# Patient Record
Sex: Female | Born: 1950
Health system: Southern US, Community
[De-identification: ages and names within clinical notes are randomized; demographics above are authoritative.]

## PROBLEM LIST (undated history)

## (undated) DIAGNOSIS — Z860100 Personal history of colon polyps, unspecified: Secondary | ICD-10-CM

## (undated) HISTORY — DX: Personal history of colon polyps, unspecified: Z86.0100

## (undated) HISTORY — PX: ABDOMINAL HYSTERECTOMY: SHX81

## (undated) HISTORY — PX: ROTATOR CUFF REPAIR: SHX139

## (undated) HISTORY — DX: Personal history of colonic polyps: Z86.010

---

## 1999-02-11 ENCOUNTER — Other Ambulatory Visit: Admission: RE | Admit: 1999-02-11 | Discharge: 1999-02-11 | Payer: Self-pay | Admitting: Gynecology

## 2000-04-10 ENCOUNTER — Other Ambulatory Visit: Admission: RE | Admit: 2000-04-10 | Discharge: 2000-04-10 | Payer: Self-pay | Admitting: Gynecology

## 2000-08-11 ENCOUNTER — Ambulatory Visit (HOSPITAL_COMMUNITY): Admission: RE | Admit: 2000-08-11 | Discharge: 2000-08-11 | Payer: Self-pay | Admitting: *Deleted

## 2000-08-11 ENCOUNTER — Encounter (INDEPENDENT_AMBULATORY_CARE_PROVIDER_SITE_OTHER): Payer: Self-pay

## 2001-04-13 ENCOUNTER — Other Ambulatory Visit: Admission: RE | Admit: 2001-04-13 | Discharge: 2001-04-13 | Payer: Self-pay | Admitting: Gynecology

## 2001-07-18 ENCOUNTER — Encounter: Payer: Self-pay | Admitting: Gynecology

## 2001-07-18 ENCOUNTER — Encounter: Admission: RE | Admit: 2001-07-18 | Discharge: 2001-07-18 | Payer: Self-pay | Admitting: Gynecology

## 2002-05-24 ENCOUNTER — Other Ambulatory Visit: Admission: RE | Admit: 2002-05-24 | Discharge: 2002-05-24 | Payer: Self-pay | Admitting: Gynecology

## 2003-02-06 ENCOUNTER — Encounter: Admission: RE | Admit: 2003-02-06 | Discharge: 2003-02-06 | Payer: Self-pay | Admitting: Gynecology

## 2003-02-06 ENCOUNTER — Encounter: Payer: Self-pay | Admitting: Gynecology

## 2003-05-12 ENCOUNTER — Other Ambulatory Visit: Admission: RE | Admit: 2003-05-12 | Discharge: 2003-05-12 | Payer: Self-pay | Admitting: Gynecology

## 2003-09-19 ENCOUNTER — Ambulatory Visit (HOSPITAL_COMMUNITY): Admission: RE | Admit: 2003-09-19 | Discharge: 2003-09-19 | Payer: Self-pay | Admitting: *Deleted

## 2003-09-19 ENCOUNTER — Encounter (INDEPENDENT_AMBULATORY_CARE_PROVIDER_SITE_OTHER): Payer: Self-pay | Admitting: Specialist

## 2004-01-14 ENCOUNTER — Emergency Department (HOSPITAL_COMMUNITY): Admission: EM | Admit: 2004-01-14 | Discharge: 2004-01-14 | Payer: Self-pay | Admitting: Emergency Medicine

## 2004-03-31 ENCOUNTER — Encounter: Admission: RE | Admit: 2004-03-31 | Discharge: 2004-03-31 | Payer: Self-pay | Admitting: Gynecology

## 2004-08-31 ENCOUNTER — Other Ambulatory Visit: Admission: RE | Admit: 2004-08-31 | Discharge: 2004-08-31 | Payer: Self-pay | Admitting: Gynecology

## 2005-05-04 ENCOUNTER — Encounter: Admission: RE | Admit: 2005-05-04 | Discharge: 2005-05-04 | Payer: Self-pay | Admitting: Family Medicine

## 2005-06-24 ENCOUNTER — Encounter: Admission: RE | Admit: 2005-06-24 | Discharge: 2005-06-24 | Payer: Self-pay | Admitting: Family Medicine

## 2005-09-19 ENCOUNTER — Other Ambulatory Visit: Admission: RE | Admit: 2005-09-19 | Discharge: 2005-09-19 | Payer: Self-pay | Admitting: Gynecology

## 2006-01-12 ENCOUNTER — Encounter: Admission: RE | Admit: 2006-01-12 | Discharge: 2006-01-12 | Payer: Self-pay | Admitting: Gynecology

## 2006-05-11 ENCOUNTER — Encounter: Admission: RE | Admit: 2006-05-11 | Discharge: 2006-05-11 | Payer: Self-pay | Admitting: Family Medicine

## 2006-05-31 ENCOUNTER — Ambulatory Visit (HOSPITAL_COMMUNITY): Admission: RE | Admit: 2006-05-31 | Discharge: 2006-06-01 | Payer: Self-pay | Admitting: Orthopedic Surgery

## 2006-09-26 ENCOUNTER — Other Ambulatory Visit: Admission: RE | Admit: 2006-09-26 | Discharge: 2006-09-26 | Payer: Self-pay | Admitting: Gynecology

## 2007-05-31 ENCOUNTER — Encounter: Admission: RE | Admit: 2007-05-31 | Discharge: 2007-05-31 | Payer: Self-pay | Admitting: Family Medicine

## 2008-06-02 ENCOUNTER — Encounter: Admission: RE | Admit: 2008-06-02 | Discharge: 2008-06-02 | Payer: Self-pay | Admitting: Family Medicine

## 2009-06-03 ENCOUNTER — Encounter: Admission: RE | Admit: 2009-06-03 | Discharge: 2009-06-03 | Payer: Self-pay | Admitting: Family Medicine

## 2010-06-04 ENCOUNTER — Encounter
Admission: RE | Admit: 2010-06-04 | Discharge: 2010-06-04 | Payer: Self-pay | Source: Home / Self Care | Attending: Gynecology | Admitting: Gynecology

## 2010-10-08 NOTE — Op Note (Signed)
Nicole Acosta, Nicole Acosta                ACCOUNT NO.:  [id number]   MEDICAL RECORD NO.:  [id number]          PATIENT TYPE:  AMB   LOCATION:  DAY                          FACILITY:  [facility]   PHYSICIAN:  Ronald A. Gioffre, M.D.DATE OF BIRTH:  1950-08-01   DATE OF PROCEDURE:  05/31/2006  DATE OF DISCHARGE:                               OPERATIVE REPORT   SURGEON:  Georges Lynch. Darrelyn Hillock, M.D.   OPERATIONS:  Jamelle Rushing, P.A.   PREOP DIAGNOSES:  1. Os acromiale left shoulder.  2. Full-thickness tear rotator cuff tendon on the right.   POSTOP DIAGNOSES:  1. Os acromiale left shoulder.  2. Full-thickness tear rotator cuff tendon on the right.   OPERATION:  1. excision of an os acromiale left shoulder.  2. Open decompression left shoulder by performing an acromionectomy      and acromioplasty.  3. A primary repair of the rotator cuff tendon, left shoulder.   DESCRIPTION OF PROCEDURE:  Under general anesthesia, a routine  orthopedic prep and draping of the left shoulder was carried out.  The  patient had 1 gram of IV Ancef.  An incision was made over the anterior  aspect of the left shoulder; bleeders identified and cauterized.  At  this time a self-retaining retractor was inserted.  I then identified  the os acromiale and excised it.  I then protected the rotator cuff with  a Bennett retractor and did a partial acromionectomy with the  oscillating saw; and an acromioplasty utilizing the bur.  I thoroughly  irrigated out the shoulder; and I removed the subdeltoid bursa as well.  I inspected the cuff, there was a small hole in the cuff which was full-  thickness.  I then did a primary repair of this with #1 Ethilon suture.  Following this, I irrigated the shoulder, again.  I reapproximated the  deltoid tendon on the muscle in the usual fashion.  Subcu was closed  with #0 Vicryl, and the skin with metal staples.  A sterile Neosporin  dressing was applied.  She was placed in a shoulder  immobilizer.           ______________________________  Georges Lynch Darrelyn Hillock, M.D.     RAG/MEDQ  D:  05/31/2006  T:  05/31/2006  Job:  [id number]

## 2011-05-20 ENCOUNTER — Other Ambulatory Visit: Payer: Self-pay | Admitting: Family Medicine

## 2011-05-20 DIAGNOSIS — Z1231 Encounter for screening mammogram for malignant neoplasm of breast: Secondary | ICD-10-CM

## 2011-06-06 ENCOUNTER — Ambulatory Visit: Payer: Self-pay

## 2011-06-07 ENCOUNTER — Ambulatory Visit
Admission: RE | Admit: 2011-06-07 | Discharge: 2011-06-07 | Disposition: A | Discharge status: Home / Self Care | Payer: BC Managed Care – PPO | Source: Ambulatory Visit | Attending: Family Medicine | Admitting: Family Medicine

## 2011-06-07 DIAGNOSIS — Z1231 Encounter for screening mammogram for malignant neoplasm of breast: Secondary | ICD-10-CM

## 2011-06-08 ENCOUNTER — Ambulatory Visit: Payer: Self-pay

## 2012-05-21 ENCOUNTER — Other Ambulatory Visit: Payer: Self-pay | Admitting: Gynecology

## 2012-05-21 DIAGNOSIS — Z1231 Encounter for screening mammogram for malignant neoplasm of breast: Secondary | ICD-10-CM

## 2012-06-08 ENCOUNTER — Ambulatory Visit
Admission: RE | Admit: 2012-06-08 | Discharge: 2012-06-08 | Disposition: A | Discharge status: Home / Self Care | Payer: BC Managed Care – PPO | Source: Ambulatory Visit | Attending: Gynecology | Admitting: Gynecology

## 2012-06-08 DIAGNOSIS — Z1231 Encounter for screening mammogram for malignant neoplasm of breast: Secondary | ICD-10-CM

## 2013-05-24 ENCOUNTER — Other Ambulatory Visit: Payer: Self-pay

## 2013-05-24 DIAGNOSIS — Z1231 Encounter for screening mammogram for malignant neoplasm of breast: Secondary | ICD-10-CM

## 2013-06-11 ENCOUNTER — Ambulatory Visit
Admission: RE | Admit: 2013-06-11 | Discharge: 2013-06-11 | Disposition: A | Discharge status: Home / Self Care | Payer: Self-pay | Source: Ambulatory Visit

## 2013-06-11 DIAGNOSIS — Z1231 Encounter for screening mammogram for malignant neoplasm of breast: Secondary | ICD-10-CM

## 2014-05-26 ENCOUNTER — Other Ambulatory Visit: Payer: Self-pay

## 2014-05-26 DIAGNOSIS — Z1231 Encounter for screening mammogram for malignant neoplasm of breast: Secondary | ICD-10-CM

## 2014-06-12 ENCOUNTER — Ambulatory Visit
Admission: RE | Admit: 2014-06-12 | Discharge: 2014-06-12 | Disposition: A | Discharge status: Home / Self Care | Payer: BLUE CROSS/BLUE SHIELD | Source: Ambulatory Visit

## 2014-06-12 DIAGNOSIS — Z1231 Encounter for screening mammogram for malignant neoplasm of breast: Secondary | ICD-10-CM

## 2015-03-30 ENCOUNTER — Other Ambulatory Visit: Payer: Self-pay

## 2015-03-30 DIAGNOSIS — Z1231 Encounter for screening mammogram for malignant neoplasm of breast: Secondary | ICD-10-CM

## 2015-06-15 ENCOUNTER — Ambulatory Visit: Payer: BLUE CROSS/BLUE SHIELD

## 2015-06-16 ENCOUNTER — Ambulatory Visit
Admission: RE | Admit: 2015-06-16 | Discharge: 2015-06-16 | Disposition: A | Discharge status: Home / Self Care | Payer: Managed Care, Other (non HMO) | Source: Ambulatory Visit

## 2015-06-16 DIAGNOSIS — Z1231 Encounter for screening mammogram for malignant neoplasm of breast: Secondary | ICD-10-CM

## 2016-02-24 DIAGNOSIS — M25562 Pain in left knee: Secondary | ICD-10-CM | POA: Diagnosis not present

## 2016-04-18 ENCOUNTER — Other Ambulatory Visit: Payer: Self-pay | Admitting: Family Medicine

## 2016-04-18 DIAGNOSIS — Z1231 Encounter for screening mammogram for malignant neoplasm of breast: Secondary | ICD-10-CM

## 2016-06-16 ENCOUNTER — Ambulatory Visit
Admission: RE | Admit: 2016-06-16 | Discharge: 2016-06-16 | Disposition: A | Discharge status: Home / Self Care | Payer: PPO | Source: Ambulatory Visit | Attending: Family Medicine | Admitting: Family Medicine

## 2016-06-16 DIAGNOSIS — Z1231 Encounter for screening mammogram for malignant neoplasm of breast: Secondary | ICD-10-CM | POA: Diagnosis not present

## 2016-06-24 DIAGNOSIS — H9011 Conductive hearing loss, unilateral, right ear, with unrestricted hearing on the contralateral side: Secondary | ICD-10-CM | POA: Diagnosis not present

## 2016-06-24 DIAGNOSIS — H6121 Impacted cerumen, right ear: Secondary | ICD-10-CM | POA: Diagnosis not present

## 2016-06-24 DIAGNOSIS — Z8669 Personal history of other diseases of the nervous system and sense organs: Secondary | ICD-10-CM | POA: Diagnosis not present

## 2016-06-24 DIAGNOSIS — F1020 Alcohol dependence, uncomplicated: Secondary | ICD-10-CM | POA: Diagnosis not present

## 2016-09-23 DIAGNOSIS — Z0000 Encounter for general adult medical examination without abnormal findings: Secondary | ICD-10-CM | POA: Diagnosis not present

## 2016-09-23 DIAGNOSIS — Z131 Encounter for screening for diabetes mellitus: Secondary | ICD-10-CM | POA: Diagnosis not present

## 2016-09-23 DIAGNOSIS — R9431 Abnormal electrocardiogram [ECG] [EKG]: Secondary | ICD-10-CM | POA: Diagnosis not present

## 2016-09-27 DIAGNOSIS — Z8249 Family history of ischemic heart disease and other diseases of the circulatory system: Secondary | ICD-10-CM | POA: Diagnosis not present

## 2016-09-27 DIAGNOSIS — R9431 Abnormal electrocardiogram [ECG] [EKG]: Secondary | ICD-10-CM | POA: Diagnosis not present

## 2017-05-08 ENCOUNTER — Other Ambulatory Visit: Payer: Self-pay | Admitting: Family Medicine

## 2017-05-08 DIAGNOSIS — Z139 Encounter for screening, unspecified: Secondary | ICD-10-CM

## 2017-06-05 DIAGNOSIS — H5203 Hypermetropia, bilateral: Secondary | ICD-10-CM | POA: Diagnosis not present

## 2017-06-05 DIAGNOSIS — H52223 Regular astigmatism, bilateral: Secondary | ICD-10-CM | POA: Diagnosis not present

## 2017-06-05 DIAGNOSIS — H524 Presbyopia: Secondary | ICD-10-CM | POA: Diagnosis not present

## 2017-06-19 ENCOUNTER — Ambulatory Visit
Admission: RE | Admit: 2017-06-19 | Discharge: 2017-06-19 | Disposition: A | Discharge status: Home / Self Care | Payer: PPO | Source: Ambulatory Visit | Attending: Family Medicine | Admitting: Family Medicine

## 2017-06-19 DIAGNOSIS — Z1231 Encounter for screening mammogram for malignant neoplasm of breast: Secondary | ICD-10-CM | POA: Diagnosis not present

## 2017-06-19 DIAGNOSIS — Z139 Encounter for screening, unspecified: Secondary | ICD-10-CM

## 2017-06-26 ENCOUNTER — Other Ambulatory Visit: Payer: Self-pay | Admitting: *Deleted

## 2017-06-26 ENCOUNTER — Encounter: Payer: Self-pay | Admitting: *Deleted

## 2017-06-26 NOTE — Patient Outreach (Signed)
[facility]TA Post screening call. Pt reports she is able to get to the MD office and has an appointment to have her AWV with Dr. Harrington Challenger in May. I encouraged her to do this instead of me coming to her as these visits are more geared towards the members who cannot get to the office easily. She said she would gladly keep her appointment with Dr. Harrington Challenger. I did tell her about [place] [facility] services and I will send her a brochure for her future reference. She has no [facility] care management needs at this time.  Nicole Acosta. Myrtie Neither, MSN, [facility] Gerontological Nurse Practitioner [facility] Care Management [phone]

## 2017-06-29 ENCOUNTER — Other Ambulatory Visit: Payer: Self-pay | Admitting: *Deleted

## 2017-06-29 NOTE — Patient Outreach (Signed)
[facility] WellCheck scheduled for Friday, February 8th at 11:00.   Eulah Pont. Myrtie Neither, MSN, [facility] Gerontological Nurse Practitioner [facility] Care Management [phone]

## 2017-06-30 ENCOUNTER — Other Ambulatory Visit: Payer: Self-pay | Admitting: *Deleted

## 2017-06-30 ENCOUNTER — Encounter: Payer: Self-pay | Admitting: *Deleted

## 2017-06-30 DIAGNOSIS — Z860100 Personal history of colon polyps, unspecified: Secondary | ICD-10-CM | POA: Insufficient documentation

## 2017-06-30 DIAGNOSIS — I10 Essential (primary) hypertension: Secondary | ICD-10-CM | POA: Insufficient documentation

## 2017-06-30 MED ORDER — LISINOPRIL 5 MG PO TABS: 5.0000 mg | ORAL_TABLET | Freq: Every day | ORAL | 0 refills | Status: AC

## 2017-06-30 NOTE — Patient Instructions (Addendum)
Ms. Favorite , Thank you for taking time see me for your Medicare Wellness Visit. I appreciate your ongoing commitment to your health goals. Please review the following plan we discussed and let me know if I can assist you in the future.   This is a list of the screening recommended for you and due dates:  Health Maintenance  Topic Date Due  .  Hepatitis C: One time screening is recommended by Center for Disease Control  (CDC) for  adults born from [birth year] through 1966.   09-Feb-1951  . Colon Cancer Screening  08/09/1999  . DEXA scan (bone density measurement)  08/09/2014  . Flu Shot  12/21/2016  . Mammogram  06/20/2019  . Tetanus Vaccine  01/10/2022  . Pneumonia vaccines  Completed    Please contact the [facility] [place] service at [phone] or e-mail at [email or web address].   Preventive Care 36 Years and Older, Female Preventive care refers to lifestyle choices and visits with your health care provider that can promote health and wellness. What does preventive care include?  A yearly physical exam. This is also called an annual well check.  Dental exams once or twice a year.  Routine eye exams. Ask your health care provider how often you should have your eyes checked.  Personal lifestyle choices, including: ? Daily care of your teeth and gums. ? Regular physical activity. ? Eating a healthy diet. ? Avoiding tobacco and drug use. ? Limiting alcohol use. ? Practicing safe sex. ? Taking low-dose aspirin every day. ? Taking vitamin and mineral supplements as recommended by your health care provider. What happens during an annual well check? The services and screenings done by your health care provider during your annual well check will depend on your age, overall health, lifestyle risk factors, and family history of disease. Counseling Your health care provider may ask you questions about your:  Alcohol use.  Tobacco use.  Drug use.  Emotional  well-being.  Home and relationship well-being.  Sexual activity.  Eating habits.  History of falls.  Memory and ability to understand (cognition).  Work and work Statistician.  Reproductive health.  Screening You may have the following tests or measurements:  Height, weight, and BMI.  Blood pressure.  Lipid and cholesterol levels. These may be checked every 5 years, or more frequently if you are over 35 years old.  Skin check.  Lung cancer screening. You may have this screening every year starting at age 64 if you have a 30-pack-year history of smoking and currently smoke or have quit within the past 15 years.  Fecal occult blood test (FOBT) of the stool. You may have this test every year starting at age 39.  Flexible sigmoidoscopy or colonoscopy. You may have a sigmoidoscopy every 5 years or a colonoscopy every 10 years starting at age 67.  Hepatitis C blood test.  Hepatitis B blood test.  Sexually transmitted disease (STD) testing.  Diabetes screening. This is done by checking your blood sugar (glucose) after you have not eaten for a while (fasting). You may have this done every 1-3 years.  Bone density scan. This is done to screen for osteoporosis. You may have this done starting at age 80.  Mammogram. This may be done every 1-2 years. Talk to your health care provider about how often you should have regular mammograms.  Talk with your health care provider about your test results, treatment options, and if necessary, the need for more tests. Vaccines Your health care provider may recommend  certain vaccines, such as:  Influenza vaccine. This is recommended every year.  Tetanus, diphtheria, and acellular pertussis (Tdap, Td) vaccine. You may need a Td booster every 10 years.  Varicella vaccine. You may need this if you have not been vaccinated.  Zoster vaccine. You may need this after age 70.  Measles, mumps, and rubella (MMR) vaccine. You may need at least  one dose of MMR if you were born in 1957 or later. You may also need a second dose.  Pneumococcal 13-valent conjugate (PCV13) vaccine. One dose is recommended after age 19.  Pneumococcal polysaccharide (PPSV23) vaccine. One dose is recommended after age 24.  Meningococcal vaccine. You may need this if you have certain conditions.  Hepatitis A vaccine. You may need this if you have certain conditions or if you travel or work in places where you may be exposed to hepatitis A.  Hepatitis B vaccine. You may need this if you have certain conditions or if you travel or work in places where you may be exposed to hepatitis B.  Haemophilus influenzae type b (Hib) vaccine. You may need this if you have certain conditions.  Talk to your health care provider about which screenings and vaccines you need and how often you need them. This information is not intended to replace advice given to you by your health care provider. Make sure you discuss any questions you have with your health care provider. Document Released: 06/05/2015 Document Revised: 01/27/2016 Document Reviewed: 03/10/2015 Elsevier Interactive Patient Education  [employer].

## 2017-06-30 NOTE — Patient Outreach (Addendum)
   [facility]A [place] Visit  Name: Nicole Acosta     DOB: 11-28-1950     MRN: [id number]  Nicole Acosta is a 67 y.o. female who presents for Medicare Annual  preventive examination.  BP (!) 164/84   Pulse 62   Resp 16   Ht 1.575 m (5\' 2" )   Wt 140 lb (63.5 kg)   SpO2 98%   BMI 25.61 kg/m   Advanced Directive information Does Patient Have a Medical Advance Directive?: Yes, Type of Advance Directive: Healthcare Power of [place];Living will scanned into [facility] Information    Name Relation Home Work [place] Son [phone]     Verlee Rossetti   [phone]     Past Medical History:  Diagnosis Date  . History of colon polyps    Past Surgical History:  Procedure Laterality Date  . ABDOMINAL HYSTERECTOMY    . ROTATOR CUFF REPAIR      Outpatient Encounter Medications as of 06/30/2017  Medication Sig  . MULTIPLE VITAMINS ESSENTIAL PO Take by mouth once.  Marland Kitchen SHINGRIX injection ADM 0.5ML IM UTD   No facility-administered encounter medications on file as of 06/30/2017.    Activities of Daily Living - independent    Patient Care Team: Lawerance Cruel, MD as PCP - General (Family Medicine)  Exercise Activities and Dietary recommendations Current Exercise Habits: Home exercise routine;Structured exercise class, Type of exercise: strength training/weights;stretching;treadmill;walking, Time (Minutes): 60, Frequency (Times/Week): 7, Weekly Exercise (Minutes/Week): 420, Intensity: Intense, Exercise limited by: None identified  Fall Risk Fall Risk  06/30/2017  Falls in the past year? No   Safety Do you have grab bars in the bathroom?: No Does your home have working smoke alarms?: Yes Do you consisitantly wear a seat belt?: Yes Depression Screen PHQ 2/9 Scores 06/30/2017 06/26/2017  PHQ - 2 Score 0 0    Immunization History  Administered Date(s) Administered  . Influenza,inj,quad, With Preservative 01/29/2016, 03/02/2017  . Pneumococcal Conjugate-13  10/16/2014  . Pneumococcal Polysaccharide-23 09/16/2015  . Tdap 01/11/2012  . Zoster 01/22/2013  Screening Tests Mammogram last done 06/19/17 Colonoscopy last done:   10/15/13, due and to be scheduled. DEXA:  Ask Dr. Harrington Challenger if needs, last done was in 2016.   Assessment: Encounter Diagnoses  Name Primary?  . Essential hypertension   . Hx of colonic polyps       Plan:    ADVISE DR. ROSS OF ELEVATED BP AT VISIT AND OFTEN ON HOME MONITOR. SUGGEST START LISINOPRIL 5 MG. Continue healthy eating habits, exercise. Counseled on reducing salt in her diet. Continue periodic BP monitoring at home. Call to schedule your colonoscopy.  I have personally reviewed and noted the following in the patient's chart:   . Medical and social history . Use of alcohol, tobacco or illicit drugs  . Current medications and supplements . Functional ability and status . Nutritional status . Physical activity . Advanced directives . List of other physicians . Hospitalizations, surgeries, and ER visits in previous 12 months . Vitals . Screenings to include cognitive, depression, and falls . Referrals and appointments  In addition, I have reviewed and discussed with patient certain preventive protocols, quality metrics, and best practice recommendations. A written personalized care plan for preventive services as well as general preventive health recommendations were provided to patient.     Eulah Pont. Teshawn Moan, MSN, GNP-BC  06/30/2017 Gerontological Nurse Practitioner [facility] Care Management

## 2017-06-30 NOTE — Patient Outreach (Signed)
[facility]A [place] Visit  Name: Nicole Acosta     DOB: Sep 28, 1950     MRN: [id number]  Nicole Acosta is a 67 y.o. female who presents for Medicare Annual  preventive examination.  BP (!) 164/84   Pulse 62   Resp 16   Ht 1.575 m (5\' 2" )   Wt 140 lb (63.5 kg)   SpO2 98%   BMI 25.61 kg/m   Advanced Directive information Does Patient Have a Medical Advance Directive?: Yes, Type of Advance Directive: Healthcare Power of [place];Living will  Contact Information    Name Relation Home Work [place] Son [phone]     Verlee Rossetti   [phone]      Past Medical History:  Diagnosis Date  . History of colon polyps    Past Surgical History:  Procedure Laterality Date  . ABDOMINAL HYSTERECTOMY    . ROTATOR CUFF REPAIR     No family history on file. Social History   Substance and Sexual Activity  Sexual Activity Not on file    Outpatient Encounter Medications as of 06/30/2017  Medication Sig  . MULTIPLE VITAMINS ESSENTIAL PO Take by mouth once.  Marland Kitchen SHINGRIX injection ADM 0.5ML IM UTD   No facility-administered encounter medications on file as of 06/30/2017.     Activities of Daily Living In your present state of health, do you have any difficulty performing the following activities: 06/30/2017  Hearing? N  Vision? N  Difficulty concentrating or making decisions? N  Walking or climbing stairs? N  Dressing or bathing? N  Doing errands, shopping? N  Preparing Food and eating ? N  Using the Toilet? N  In the past six months, have you accidently leaked urine? N  Do you have problems with loss of bowel control? N  Managing your Medications? N  Managing your Finances? N  Housekeeping or managing your Housekeeping? N  Some recent data might be hidden    Patient Care Team: Lawerance Cruel, MD as PCP - General (Family Medicine)  Diagnosis:    HTN Hx Colon Polyps   Exercise Activities and Dietary recommendations Current Exercise Habits: Home  exercise routine;Structured exercise class, Type of exercise: strength training/weights;stretching;treadmill;walking, Time (Minutes): 60, Frequency (Times/Week): 7, Weekly Exercise (Minutes/Week): 420, Intensity: Intense, Exercise limited by: None identified   Fall Risk Fall Risk  06/30/2017  Falls in the past year? No   Safety Do you have grab bars in the bathroom?: No Does your home have working smoke alarms?: Yes Do you consisitantly wear a seat belt?: Yes Depression Screen PHQ 2/9 Scores 06/30/2017 06/26/2017  PHQ - 2 Score 0 0    Immunization History  Administered Date(s) Administered  . Influenza,inj,quad, With Preservative 01/29/2016, 03/02/2017  . Pneumococcal Conjugate-13 10/16/2014  . Pneumococcal Polysaccharide-23 09/16/2015  . Tdap 01/11/2012  . Zoster 01/22/2013  Screening Tests Mammogram last done 06/19/17 Colonoscopy last done: DEXA:  Ask Dr. Harrington Challenger if needs      Plan:    ADVISE DR. ROSS OF ELEVATED BP AT VISIT AND OFTEN ON HOME MONITOR. SUGGEST START LISINOPRIL 5 MG. Pt to follow up with Dr. Harrington Challenger in May. Continue good health practices currently in action!  I have personally reviewed and noted the following in the patient's chart:   . Medical and social history . Use of alcohol, tobacco or illicit drugs  . Current medications and supplements . Functional ability and status . Nutritional status . Physical activity . Advanced directives . List of  other physicians . Hospitalizations, surgeries, and ER visits in previous 12 months . Vitals . Screenings to include cognitive, depression, and falls . Referrals and appointments  In addition, I have reviewed and discussed with patient certain preventive protocols, quality metrics, and best practice recommendations. A written personalized care plan for preventive services as well as general preventive health recommendations were provided to patient.     Eulah Pont. Antione Obar, MSN, GNP-BC  06/30/2017 Gerontological Nurse  Practitioner [facility] Care Management

## 2017-06-30 NOTE — Patient Outreach (Addendum)
HTA [name] Visit  Name: Nicole Acosta     DOB: 28-Nov-1950     MRN: [id number]  Nicole Acosta is a 67 y.o. female who presents for Medicare Annual  preventive examination.  BP (!) 164/84   Pulse 62   Resp 16   Ht 1.575 m (5\' 2" )   Wt 140 lb (63.5 kg)   SpO2 98%   BMI 25.61 kg/m   Advanced Directive information Does Patient Have a Medical Advance Directive?: Yes, Type of Advance Directive: Healthcare Power of [place];Living will  Contact Information    Name Relation Home Work [place] Son [phone]     Verlee Rossetti   [phone]      Past Medical History:  Diagnosis Date  . History of colon polyps    Past Surgical History:  Procedure Laterality Date  . ABDOMINAL HYSTERECTOMY    . ROTATOR CUFF REPAIR     No family history on file. Social History   Substance and Sexual Activity  Sexual Activity Not on file    Outpatient Encounter Medications as of 06/30/2017  Medication Sig  . MULTIPLE VITAMINS ESSENTIAL PO Take by mouth once.  Marland Kitchen SHINGRIX injection ADM 0.5ML IM UTD   No facility-administered encounter medications on file as of 06/30/2017.     Activities of Daily Living In your present state of health, do you have any difficulty performing the following activities: 06/30/2017  Hearing? N  Vision? N  Difficulty concentrating or making decisions? N  Walking or climbing stairs? N  Dressing or bathing? N  Doing errands, shopping? N  Preparing Food and eating ? N  Using the Toilet? N  In the past six months, have you accidently leaked urine? N  Do you have problems with loss of bowel control? N  Managing your Medications? N  Managing your Finances? N  Housekeeping or managing your Housekeeping? N  Some recent data might be hidden    Patient Care Team: Lawerance Cruel, MD as PCP - General (Family Medicine)  Diagnosis:    HTN Hx Colon Polyps   Exercise Activities and Dietary recommendations Current Exercise Habits: Home  exercise routine;Structured exercise class, Type of exercise: strength training/weights;stretching;treadmill;walking, Time (Minutes): 60, Frequency (Times/Week): 7, Weekly Exercise (Minutes/Week): 420, Intensity: Intense, Exercise limited by: None identified   Fall Risk Fall Risk  06/30/2017  Falls in the past year? No   Safety Do you have grab bars in the bathroom?: No Does your home have working smoke alarms?: Yes Do you consisitantly wear a seat belt?: Yes Depression Screen PHQ 2/9 Scores 06/30/2017 06/26/2017  PHQ - 2 Score 0 0    Immunization History  Administered Date(s) Administered  . Influenza,inj,quad, With Preservative 01/29/2016, 03/02/2017  . Pneumococcal Conjugate-13 10/16/2014  . Pneumococcal Polysaccharide-23 09/16/2015  . Tdap 01/11/2012  . Zoster 01/22/2013  Screening Tests Mammogram last done 06/19/17 Colonoscopy last done: DEXA:  Ask Dr. Harrington Challenger if needs      Plan:    ADVISE DR. ROSS OF ELEVATED BP AT VISIT AND OFTEN ON HOME MONITOR. SUGGEST START LISINOPRIL 5 MG. Pt to follow up with Dr. Harrington Challenger in May. Continue good health practices currently in action!  I have personally reviewed and noted the following in the patient's chart:   . Medical and social history . Use of alcohol, tobacco or illicit drugs  . Current medications and supplements . Functional ability and status . Nutritional status . Physical activity . Advanced directives . List of  other physicians . Hospitalizations, surgeries, and ER visits in previous 12 months . Vitals . Screenings to include cognitive, depression, and falls . Referrals and appointments  In addition, I have reviewed and discussed with patient certain preventive protocols, quality metrics, and best practice recommendations. A written personalized care plan for preventive services as well as general preventive health recommendations were provided to patient.     Eulah Pont. Sachin Ferencz, MSN, GNP-BC  06/30/2017 Gerontological Nurse  Practitioner [facility] Care Management

## 2017-06-30 NOTE — Addendum Note (Signed)
Addended by: Deloria Lair on: 06/30/2017 05:17 PM   Modules accepted: Orders

## 2017-07-21 DIAGNOSIS — I10 Essential (primary) hypertension: Secondary | ICD-10-CM | POA: Diagnosis not present

## 2017-08-15 DIAGNOSIS — Z86010 Personal history of colonic polyps: Secondary | ICD-10-CM | POA: Diagnosis not present

## 2017-08-15 DIAGNOSIS — K640 First degree hemorrhoids: Secondary | ICD-10-CM | POA: Diagnosis not present

## 2017-10-03 DIAGNOSIS — I10 Essential (primary) hypertension: Secondary | ICD-10-CM | POA: Diagnosis not present

## 2017-10-03 DIAGNOSIS — Z0000 Encounter for general adult medical examination without abnormal findings: Secondary | ICD-10-CM | POA: Diagnosis not present

## 2017-12-01 LAB — GLUCOSE, POCT (MANUAL RESULT ENTRY): POC GLUCOSE: 92 mg/dL (ref 70–99)

## 2018-05-24 ENCOUNTER — Other Ambulatory Visit: Payer: Self-pay | Admitting: Family Medicine

## 2018-05-24 DIAGNOSIS — Z1231 Encounter for screening mammogram for malignant neoplasm of breast: Secondary | ICD-10-CM

## 2018-06-22 ENCOUNTER — Ambulatory Visit: Payer: PPO

## 2018-07-17 ENCOUNTER — Ambulatory Visit
Admission: RE | Admit: 2018-07-17 | Discharge: 2018-07-17 | Disposition: A | Discharge status: Home / Self Care | Payer: PPO | Source: Ambulatory Visit | Attending: Family Medicine | Admitting: Family Medicine

## 2018-07-17 DIAGNOSIS — Z1231 Encounter for screening mammogram for malignant neoplasm of breast: Secondary | ICD-10-CM | POA: Diagnosis not present

## 2019-02-05 DIAGNOSIS — H6123 Impacted cerumen, bilateral: Secondary | ICD-10-CM | POA: Diagnosis not present

## 2019-03-07 DIAGNOSIS — R3915 Urgency of urination: Secondary | ICD-10-CM | POA: Diagnosis not present

## 2019-03-07 DIAGNOSIS — Z0000 Encounter for general adult medical examination without abnormal findings: Secondary | ICD-10-CM | POA: Diagnosis not present

## 2019-03-07 DIAGNOSIS — I10 Essential (primary) hypertension: Secondary | ICD-10-CM | POA: Diagnosis not present

## 2019-04-08 ENCOUNTER — Other Ambulatory Visit: Payer: Self-pay | Admitting: Family Medicine

## 2019-04-08 DIAGNOSIS — Z1231 Encounter for screening mammogram for malignant neoplasm of breast: Secondary | ICD-10-CM

## 2019-07-11 ENCOUNTER — Ambulatory Visit: Payer: PPO

## 2019-08-12 ENCOUNTER — Other Ambulatory Visit: Payer: Self-pay

## 2019-08-12 ENCOUNTER — Ambulatory Visit
Admission: RE | Admit: 2019-08-12 | Discharge: 2019-08-12 | Disposition: A | Discharge status: Home / Self Care | Payer: PPO | Source: Ambulatory Visit | Attending: Family Medicine | Admitting: Family Medicine

## 2019-08-12 DIAGNOSIS — Z1231 Encounter for screening mammogram for malignant neoplasm of breast: Secondary | ICD-10-CM

## 2019-08-15 ENCOUNTER — Ambulatory Visit: Payer: PPO

## 2020-02-04 ENCOUNTER — Ambulatory Visit: Payer: PPO | Attending: Internal Medicine

## 2020-02-04 DIAGNOSIS — Z23 Encounter for immunization: Secondary | ICD-10-CM

## 2020-02-04 NOTE — Progress Notes (Signed)
   Covid-19 Vaccination Clinic  Name:  Nicole Acosta    MRN: [id number] DOB: [birth year]  02/04/2020  Nicole Acosta was observed post Covid-19 immunization for 15 minutes without incident. She was provided with Vaccine Information Sheet and instruction to access the V-Safe system.   Nicole Acosta was instructed to call 911 with any severe reactions post vaccine: Marland Kitchen Difficulty breathing  . Swelling of face and throat  . A fast heartbeat  . A bad rash all over body  . Dizziness and weakness

## 2020-02-10 DIAGNOSIS — M954 Acquired deformity of chest and rib: Secondary | ICD-10-CM | POA: Diagnosis not present

## 2020-03-11 DIAGNOSIS — Z0000 Encounter for general adult medical examination without abnormal findings: Secondary | ICD-10-CM | POA: Diagnosis not present

## 2020-03-11 DIAGNOSIS — Z1389 Encounter for screening for other disorder: Secondary | ICD-10-CM | POA: Diagnosis not present

## 2020-03-19 DIAGNOSIS — D179 Benign lipomatous neoplasm, unspecified: Secondary | ICD-10-CM | POA: Diagnosis not present

## 2020-03-19 DIAGNOSIS — I10 Essential (primary) hypertension: Secondary | ICD-10-CM | POA: Diagnosis not present

## 2020-03-23 ENCOUNTER — Other Ambulatory Visit: Payer: Self-pay | Admitting: Family Medicine

## 2020-03-23 DIAGNOSIS — E2839 Other primary ovarian failure: Secondary | ICD-10-CM

## 2020-05-18 ENCOUNTER — Other Ambulatory Visit: Payer: Self-pay | Admitting: Family Medicine

## 2020-05-18 DIAGNOSIS — Z1231 Encounter for screening mammogram for malignant neoplasm of breast: Secondary | ICD-10-CM

## 2020-06-30 ENCOUNTER — Other Ambulatory Visit: Payer: PPO

## 2020-08-12 ENCOUNTER — Ambulatory Visit
Admission: RE | Admit: 2020-08-12 | Discharge: 2020-08-12 | Disposition: A | Discharge status: Home / Self Care | Payer: PPO | Source: Ambulatory Visit | Attending: Family Medicine | Admitting: Family Medicine

## 2020-08-12 ENCOUNTER — Other Ambulatory Visit: Payer: Self-pay

## 2020-08-12 DIAGNOSIS — Z1231 Encounter for screening mammogram for malignant neoplasm of breast: Secondary | ICD-10-CM | POA: Diagnosis not present

## 2020-12-22 ENCOUNTER — Other Ambulatory Visit: Payer: Self-pay | Admitting: Family Medicine

## 2020-12-22 DIAGNOSIS — E2839 Other primary ovarian failure: Secondary | ICD-10-CM

## 2020-12-25 ENCOUNTER — Other Ambulatory Visit: Payer: PPO

## 2021-03-12 DIAGNOSIS — R413 Other amnesia: Secondary | ICD-10-CM | POA: Diagnosis not present

## 2021-03-12 DIAGNOSIS — Z0000 Encounter for general adult medical examination without abnormal findings: Secondary | ICD-10-CM | POA: Diagnosis not present

## 2021-03-12 DIAGNOSIS — Z136 Encounter for screening for cardiovascular disorders: Secondary | ICD-10-CM | POA: Diagnosis not present

## 2021-03-12 DIAGNOSIS — I10 Essential (primary) hypertension: Secondary | ICD-10-CM | POA: Diagnosis not present

## 2021-03-12 DIAGNOSIS — R531 Weakness: Secondary | ICD-10-CM | POA: Diagnosis not present

## 2021-03-12 DIAGNOSIS — Z13220 Encounter for screening for lipoid disorders: Secondary | ICD-10-CM | POA: Diagnosis not present

## 2021-03-17 DIAGNOSIS — Z0000 Encounter for general adult medical examination without abnormal findings: Secondary | ICD-10-CM | POA: Diagnosis not present

## 2021-05-31 ENCOUNTER — Other Ambulatory Visit: Payer: Self-pay | Admitting: Family Medicine

## 2021-05-31 DIAGNOSIS — Z1231 Encounter for screening mammogram for malignant neoplasm of breast: Secondary | ICD-10-CM

## 2021-06-03 ENCOUNTER — Ambulatory Visit
Admission: RE | Admit: 2021-06-03 | Discharge: 2021-06-03 | Disposition: A | Discharge status: Home / Self Care | Payer: PPO | Source: Ambulatory Visit | Attending: Family Medicine | Admitting: Family Medicine

## 2021-06-03 DIAGNOSIS — E2839 Other primary ovarian failure: Secondary | ICD-10-CM

## 2021-06-03 DIAGNOSIS — M85852 Other specified disorders of bone density and structure, left thigh: Secondary | ICD-10-CM | POA: Diagnosis not present

## 2021-06-03 DIAGNOSIS — Z780 Asymptomatic menopausal state: Secondary | ICD-10-CM | POA: Diagnosis not present

## 2021-06-14 ENCOUNTER — Other Ambulatory Visit: Payer: PPO

## 2021-08-13 ENCOUNTER — Ambulatory Visit
Admission: RE | Admit: 2021-08-13 | Discharge: 2021-08-13 | Disposition: A | Discharge status: Home / Self Care | Payer: PPO | Source: Ambulatory Visit | Attending: Family Medicine | Admitting: Family Medicine

## 2021-08-13 DIAGNOSIS — Z1231 Encounter for screening mammogram for malignant neoplasm of breast: Secondary | ICD-10-CM

## 2021-08-25 DIAGNOSIS — R922 Inconclusive mammogram: Secondary | ICD-10-CM | POA: Diagnosis not present

## 2022-03-15 DIAGNOSIS — R8290 Unspecified abnormal findings in urine: Secondary | ICD-10-CM | POA: Diagnosis not present

## 2022-03-15 DIAGNOSIS — I10 Essential (primary) hypertension: Secondary | ICD-10-CM | POA: Diagnosis not present

## 2022-03-18 DIAGNOSIS — Z0000 Encounter for general adult medical examination without abnormal findings: Secondary | ICD-10-CM | POA: Diagnosis not present

## 2022-03-21 DIAGNOSIS — Z6827 Body mass index (BMI) 27.0-27.9, adult: Secondary | ICD-10-CM | POA: Diagnosis not present

## 2022-03-21 DIAGNOSIS — Z0000 Encounter for general adult medical examination without abnormal findings: Secondary | ICD-10-CM | POA: Diagnosis not present

## 2022-03-21 DIAGNOSIS — I10 Essential (primary) hypertension: Secondary | ICD-10-CM | POA: Diagnosis not present

## 2022-03-25 ENCOUNTER — Other Ambulatory Visit: Payer: Self-pay | Admitting: Family Medicine

## 2022-03-25 DIAGNOSIS — Z1231 Encounter for screening mammogram for malignant neoplasm of breast: Secondary | ICD-10-CM

## 2022-08-16 ENCOUNTER — Ambulatory Visit
Admission: RE | Admit: 2022-08-16 | Discharge: 2022-08-16 | Disposition: A | Discharge status: Home / Self Care | Payer: PPO | Source: Ambulatory Visit | Attending: Family Medicine | Admitting: Family Medicine

## 2022-08-16 DIAGNOSIS — Z1231 Encounter for screening mammogram for malignant neoplasm of breast: Secondary | ICD-10-CM | POA: Diagnosis not present

## 2022-12-07 DIAGNOSIS — M25551 Pain in right hip: Secondary | ICD-10-CM | POA: Diagnosis not present

## 2022-12-07 DIAGNOSIS — M25512 Pain in left shoulder: Secondary | ICD-10-CM | POA: Diagnosis not present

## 2022-12-07 DIAGNOSIS — Z6826 Body mass index (BMI) 26.0-26.9, adult: Secondary | ICD-10-CM | POA: Diagnosis not present

## 2023-01-06 DIAGNOSIS — M25512 Pain in left shoulder: Secondary | ICD-10-CM | POA: Diagnosis not present

## 2023-01-06 DIAGNOSIS — M25551 Pain in right hip: Secondary | ICD-10-CM | POA: Diagnosis not present

## 2023-01-20 DIAGNOSIS — M25551 Pain in right hip: Secondary | ICD-10-CM | POA: Diagnosis not present

## 2023-01-27 DIAGNOSIS — M25551 Pain in right hip: Secondary | ICD-10-CM | POA: Diagnosis not present

## 2023-02-04 IMAGING — MG MM DIGITAL SCREENING BILAT W/ TOMO AND CAD
8 series · 9 of 24 positions shown · non-contrast
Comparison: Previous exam(s).

CLINICAL DATA: Screening.

EXAM:
DIGITAL SCREENING BILATERAL MAMMOGRAM WITH TOMOSYNTHESIS AND CAD
TECHNIQUE: Bilateral screening digital craniocaudal and mediolateral oblique
mammograms were obtained. Bilateral screening digital breast
tomosynthesis was performed. The images were evaluated with
computer-aided detection.

[R CC synth-2D]
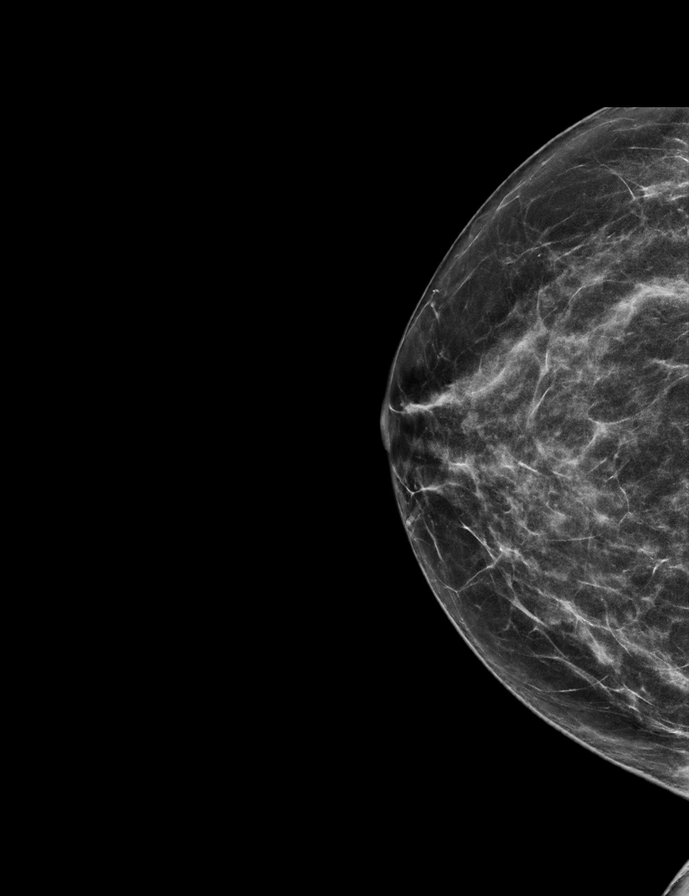

[L CC synth-2D]
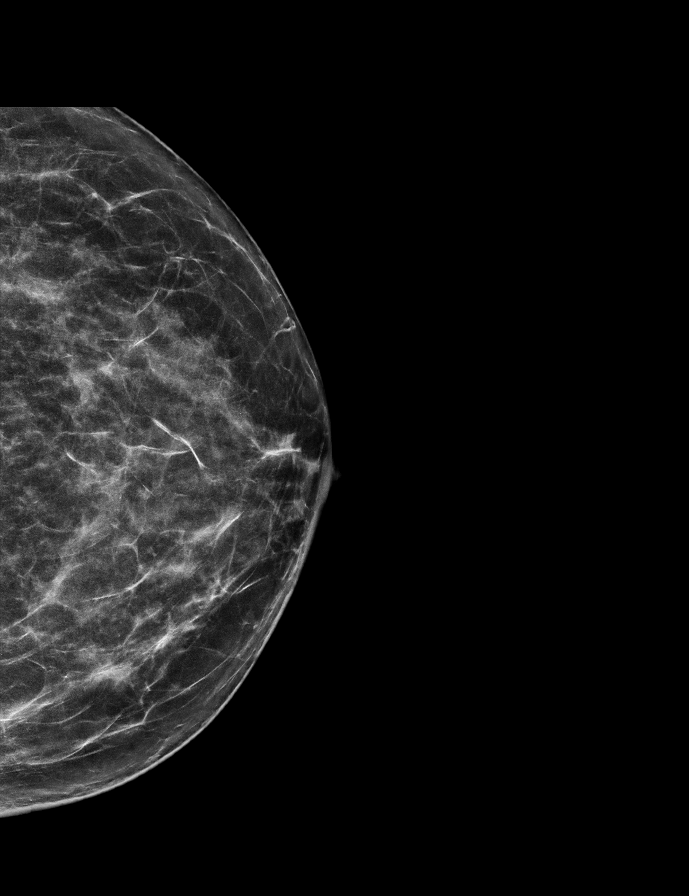

[L MLO synth-2D]
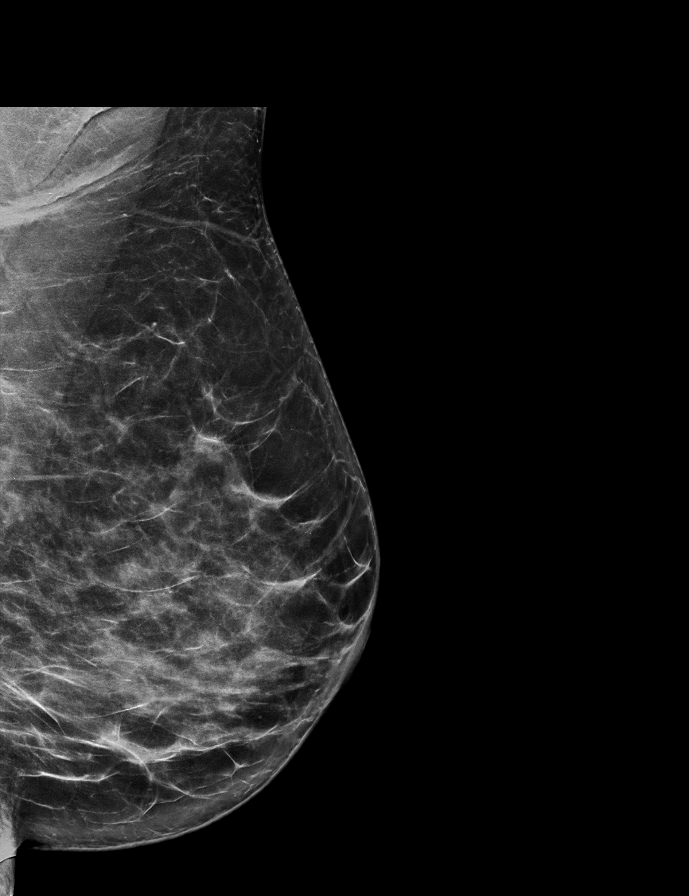

[R MLO synth-2D]
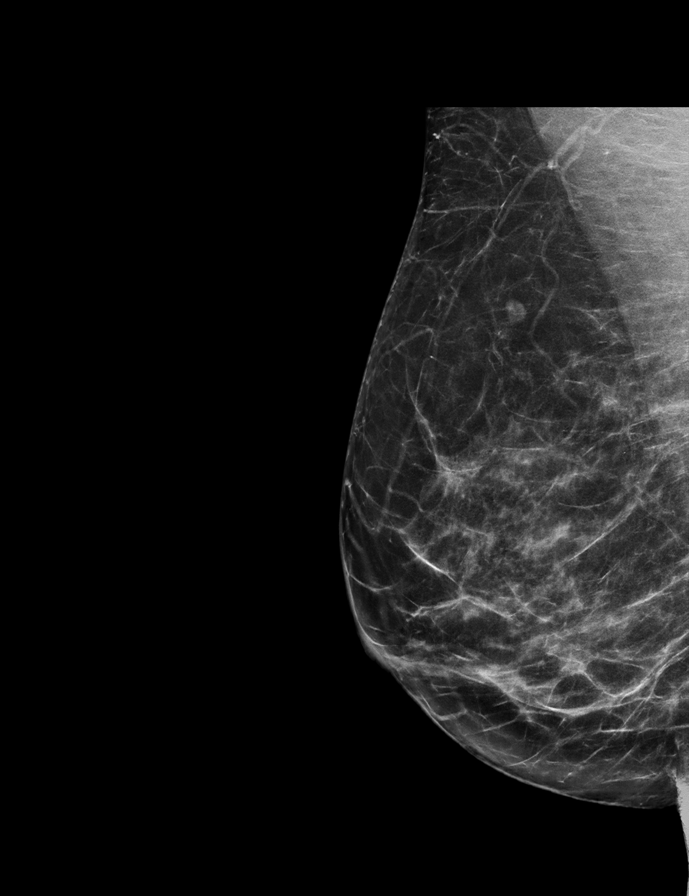

[L MLO tomo · 2 of 70 frames shown]
[frame 23/70]
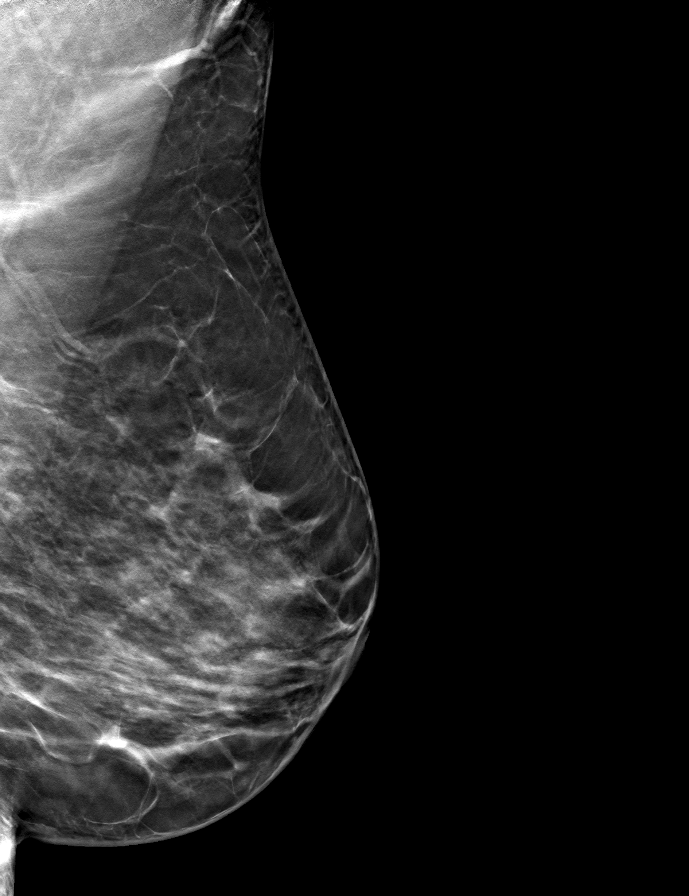
[frame 35/70]
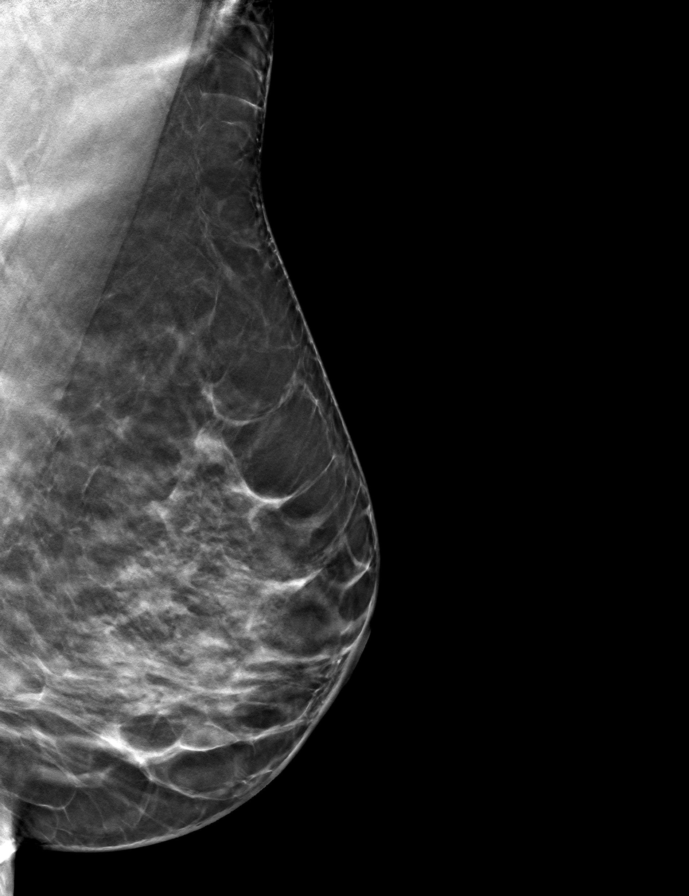

[L CC tomo · tomo slice 31/61.0]
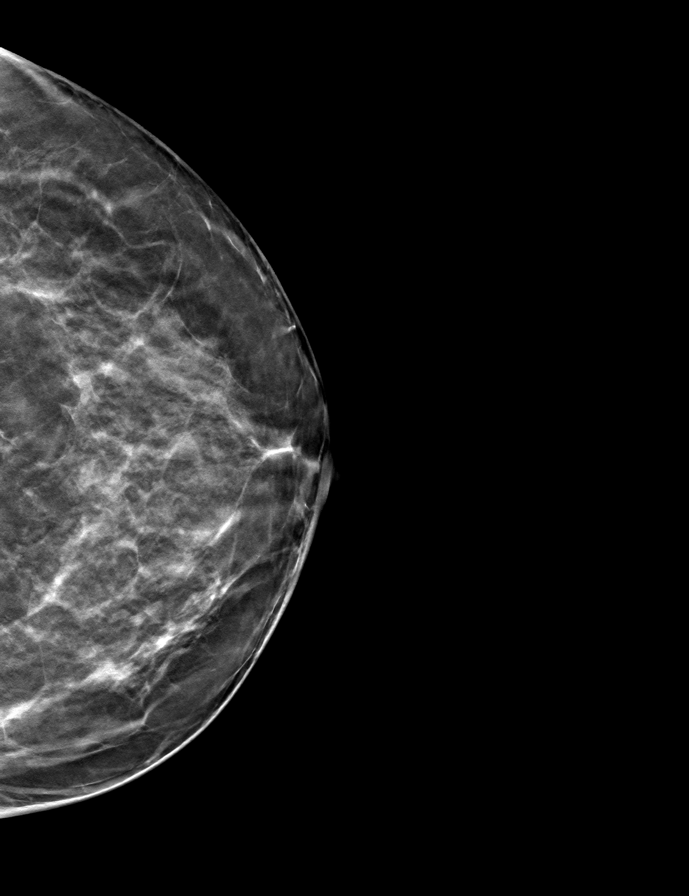

[R MLO tomo · tomo slice 33/65.0]
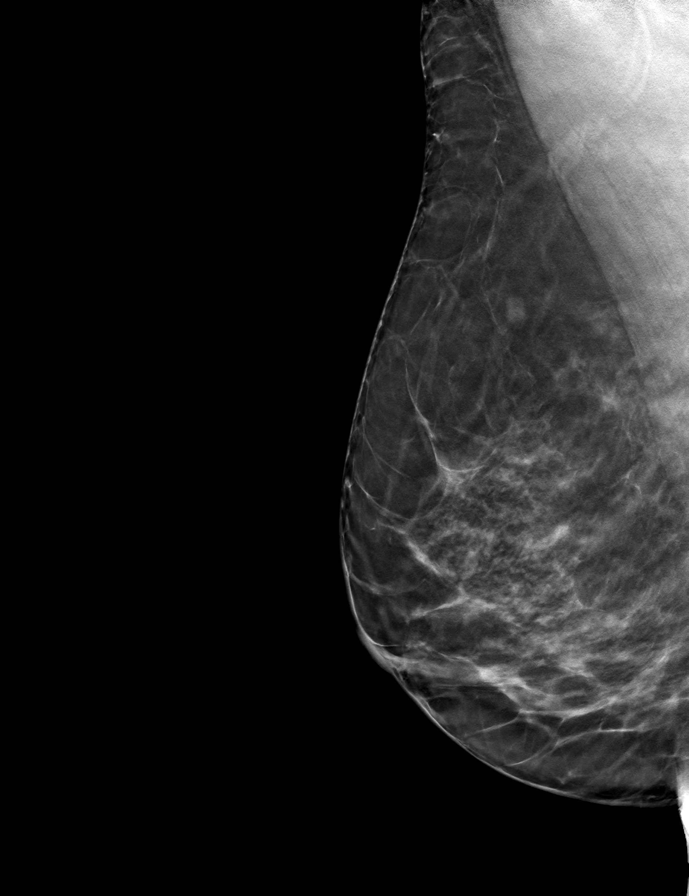

[R CC tomo · tomo slice 30/59.0]
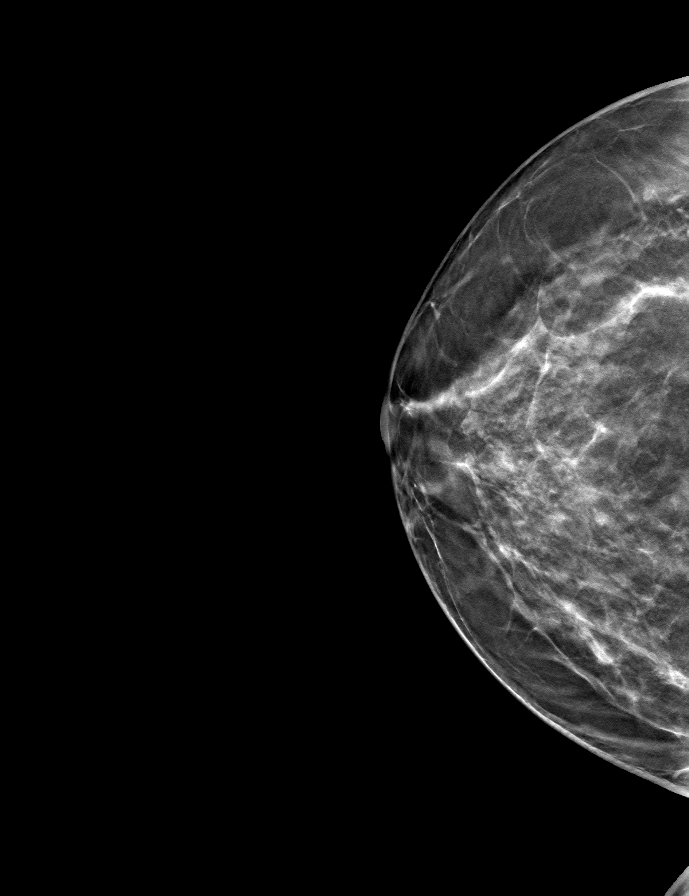

[9 of 24 positions shown; findings below may reference images not displayed]

ACR Breast Density Category c: The breast tissue is heterogeneously
dense, which may obscure small masses.
FINDINGS: There are no findings suspicious for malignancy.
IMPRESSION: No mammographic evidence of malignancy. A result letter of this
screening mammogram will be mailed directly to the patient.

RECOMMENDATION:
Screening mammogram in one year. (Code:Q3-W-BC3)

BI-RADS CATEGORY  1: Negative.

## 2023-03-21 DIAGNOSIS — Z0000 Encounter for general adult medical examination without abnormal findings: Secondary | ICD-10-CM | POA: Diagnosis not present

## 2023-03-21 DIAGNOSIS — Z1331 Encounter for screening for depression: Secondary | ICD-10-CM | POA: Diagnosis not present

## 2023-03-21 DIAGNOSIS — Z23 Encounter for immunization: Secondary | ICD-10-CM | POA: Diagnosis not present

## 2023-03-21 DIAGNOSIS — Z1211 Encounter for screening for malignant neoplasm of colon: Secondary | ICD-10-CM | POA: Diagnosis not present

## 2023-03-21 DIAGNOSIS — Z6825 Body mass index (BMI) 25.0-25.9, adult: Secondary | ICD-10-CM | POA: Diagnosis not present

## 2023-03-28 DIAGNOSIS — I10 Essential (primary) hypertension: Secondary | ICD-10-CM | POA: Diagnosis not present

## 2023-03-28 DIAGNOSIS — Z1211 Encounter for screening for malignant neoplasm of colon: Secondary | ICD-10-CM | POA: Diagnosis not present

## 2023-03-28 DIAGNOSIS — Z6825 Body mass index (BMI) 25.0-25.9, adult: Secondary | ICD-10-CM | POA: Diagnosis not present

## 2023-03-28 DIAGNOSIS — Z0000 Encounter for general adult medical examination without abnormal findings: Secondary | ICD-10-CM | POA: Diagnosis not present

## 2023-03-28 DIAGNOSIS — E7800 Pure hypercholesterolemia, unspecified: Secondary | ICD-10-CM | POA: Diagnosis not present

## 2023-03-28 DIAGNOSIS — M8588 Other specified disorders of bone density and structure, other site: Secondary | ICD-10-CM | POA: Diagnosis not present

## 2023-04-01 DIAGNOSIS — Z1211 Encounter for screening for malignant neoplasm of colon: Secondary | ICD-10-CM | POA: Diagnosis not present

## 2023-04-01 DIAGNOSIS — Z1212 Encounter for screening for malignant neoplasm of rectum: Secondary | ICD-10-CM | POA: Diagnosis not present

## 2023-05-04 ENCOUNTER — Other Ambulatory Visit: Payer: Self-pay | Admitting: Family Medicine

## 2023-05-04 DIAGNOSIS — Z1231 Encounter for screening mammogram for malignant neoplasm of breast: Secondary | ICD-10-CM

## 2023-05-12 DIAGNOSIS — R195 Other fecal abnormalities: Secondary | ICD-10-CM | POA: Diagnosis not present

## 2023-05-12 DIAGNOSIS — K5900 Constipation, unspecified: Secondary | ICD-10-CM | POA: Diagnosis not present

## 2023-08-18 ENCOUNTER — Ambulatory Visit
Admission: RE | Admit: 2023-08-18 | Discharge: 2023-08-18 | Disposition: A | Discharge status: Home / Self Care | Payer: PPO | Source: Ambulatory Visit | Attending: Family Medicine | Admitting: Family Medicine

## 2023-08-18 DIAGNOSIS — Z1231 Encounter for screening mammogram for malignant neoplasm of breast: Secondary | ICD-10-CM

## 2024-03-21 DIAGNOSIS — Z0000 Encounter for general adult medical examination without abnormal findings: Secondary | ICD-10-CM | POA: Diagnosis not present

## 2024-03-21 DIAGNOSIS — Z1331 Encounter for screening for depression: Secondary | ICD-10-CM | POA: Diagnosis not present

## 2024-03-27 DIAGNOSIS — E7800 Pure hypercholesterolemia, unspecified: Secondary | ICD-10-CM | POA: Diagnosis not present

## 2024-03-27 DIAGNOSIS — Z0000 Encounter for general adult medical examination without abnormal findings: Secondary | ICD-10-CM | POA: Diagnosis not present

## 2024-03-27 DIAGNOSIS — I10 Essential (primary) hypertension: Secondary | ICD-10-CM | POA: Diagnosis not present

## 2024-03-27 DIAGNOSIS — Z6825 Body mass index (BMI) 25.0-25.9, adult: Secondary | ICD-10-CM | POA: Diagnosis not present

## 2024-06-19 ENCOUNTER — Other Ambulatory Visit: Payer: Self-pay | Admitting: Family Medicine

## 2024-06-19 DIAGNOSIS — Z1231 Encounter for screening mammogram for malignant neoplasm of breast: Secondary | ICD-10-CM

## 2024-08-19 ENCOUNTER — Ambulatory Visit
